# Patient Record
Sex: Male | Born: 1990 | Race: Black or African American | Hispanic: No | Marital: Single | Smoking: Current every day smoker
Health system: Southern US, Community
[De-identification: ages and names within clinical notes are randomized; demographics above are authoritative.]

---

## 2015-01-15 ENCOUNTER — Encounter (HOSPITAL_BASED_OUTPATIENT_CLINIC_OR_DEPARTMENT_OTHER): Payer: Self-pay | Admitting: *Deleted

## 2015-01-15 ENCOUNTER — Emergency Department (HOSPITAL_BASED_OUTPATIENT_CLINIC_OR_DEPARTMENT_OTHER)
Admission: EM | Admit: 2015-01-15 | Discharge: 2015-01-15 | Disposition: A | Discharge status: Home / Self Care | Payer: BLUE CROSS/BLUE SHIELD | Attending: Emergency Medicine | Admitting: Emergency Medicine

## 2015-01-15 ENCOUNTER — Emergency Department (HOSPITAL_BASED_OUTPATIENT_CLINIC_OR_DEPARTMENT_OTHER): Payer: BLUE CROSS/BLUE SHIELD

## 2015-01-15 DIAGNOSIS — W01198A Fall on same level from slipping, tripping and stumbling with subsequent striking against other object, initial encounter: Secondary | ICD-10-CM | POA: Diagnosis not present

## 2015-01-15 DIAGNOSIS — Y92310 Basketball court as the place of occurrence of the external cause: Secondary | ICD-10-CM | POA: Insufficient documentation

## 2015-01-15 DIAGNOSIS — Y9367 Activity, basketball: Secondary | ICD-10-CM | POA: Diagnosis not present

## 2015-01-15 DIAGNOSIS — M25532 Pain in left wrist: Secondary | ICD-10-CM

## 2015-01-15 DIAGNOSIS — S6992XA Unspecified injury of left wrist, hand and finger(s), initial encounter: Secondary | ICD-10-CM | POA: Insufficient documentation

## 2015-01-15 DIAGNOSIS — Y998 Other external cause status: Secondary | ICD-10-CM | POA: Diagnosis not present

## 2015-01-15 MED ORDER — IBUPROFEN 800 MG PO TABS
800.0000 mg | ORAL_TABLET | Freq: Once | ORAL | Status: AC
  Administered 2015-01-15: 800 mg via ORAL
  Filled 2015-01-15: qty 1

## 2015-01-15 MED ORDER — IBUPROFEN 600 MG PO TABS: 600.0000 mg | ORAL_TABLET | Freq: Four times a day (QID) | ORAL | Status: DC | PRN

## 2015-01-15 NOTE — ED Notes (Signed)
Patient transported to X-ray 

## 2015-01-15 NOTE — Discharge Instructions (Signed)

## 2015-01-15 NOTE — ED Notes (Signed)
Left wrist injury. He fell on his wrist while playing basketball yesterday.

## 2015-01-15 NOTE — ED Provider Notes (Signed)
CSN: [id number]     Arrival date & time 01/15/15  1613 History   First MD Initiated Contact with Patient 01/15/15 1632     Chief Complaint  Patient presents with  . Wrist Pain     (Consider location/radiation/quality/duration/timing/severity/associated sxs/prior Treatment) HPI Joseph Benjamin is a 24 y.o. male who comes in for evaluation of left wrist pain. Patient states last night he fell forward landing on his wrist while playing basketball. He reports immediate onset throbbing wrist pain that he rates as 9/10. He has not tried anything to improve his symptoms. However, he does report elevating his wrist will sometimes help, but letting it hang down worsens his symptoms. He does report intermittent tingling sensation. Denies any elbow pain. Reports decreased range of motion secondary to pain. No other aggravating or modifying factors.  History reviewed. No pertinent past medical history. History reviewed. No pertinent past surgical history. No family history on file. History  Substance Use Topics  . Smoking status: Never Smoker   . Smokeless tobacco: Not on file  . Alcohol Use: No    Review of Systems A 10 point review of systems was completed and was negative except for pertinent positives and negatives as mentioned in the history of present illness     Allergies  Review of patient's allergies indicates no known allergies.  Home Medications   Prior to Admission medications   Medication Sig Start Date End Date Taking? Authorizing Provider  ibuprofen (ADVIL,MOTRIN) 600 MG tablet Take 1 tablet (600 mg total) by mouth every 6 (six) hours as needed. 01/15/15   Joycie PeekBenjamin Meoshia Billing, PA-C   BP 122/67 mmHg  Pulse 84  Temp(Src) 98.2 F (36.8 C) (Oral)  Resp 16  Ht 5\' 7"  (1.702 m)  Wt 172 lb (78.019 kg)  BMI 26.93 kg/m2  SpO2 100% Physical Exam  Constitutional:  Awake, alert, nontoxic appearance.  HENT:  Head: Atraumatic.  Eyes: Right eye exhibits no discharge. Left eye  exhibits no discharge.  Neck: Neck supple.  Pulmonary/Chest: Effort normal. He exhibits no tenderness.  Abdominal: Soft. There is no tenderness. There is no rebound.  Musculoskeletal: He exhibits no tenderness.  No focal tenderness to left wrist. No obvious deformities or lesions noted. There is diffuse swelling around left wrist. Maintains full active range of motion with flexion, decreased range of motion with extension, secondary only to pain. Grip strength slightly decreased secondary to pain also. Distal pulses are intact with brisk cap refill.  Neurological:  Mental status and motor strength appears baseline for patient and situation. Sensation intact to light touch. No focal neurodeficits.  Skin: No rash noted.  Psychiatric: He has a normal mood and affect.  Nursing note and vitals reviewed.   ED Course  Procedures (including critical care time) Labs Review Labs Reviewed - No data to display  Imaging Review Dg Wrist Complete Left  01/15/2015   CLINICAL DATA:  Acute left wrist pain after fall playing basketball yesterday. Initial encounter.  EXAM: LEFT WRIST - COMPLETE 3+ VIEW  COMPARISON:  None.  FINDINGS: There is no evidence of fracture or dislocation. There is no evidence of arthropathy or other focal bone abnormality. Soft tissues are unremarkable.  IMPRESSION: Normal left wrist.   Electronically Signed   By: Lupita RaiderJames  Green Jr, M.D.   On: 01/15/2015 16:42     EKG Interpretation None     Meds given in ED:  Medications  ibuprofen (ADVIL,MOTRIN) tablet 800 mg (not administered)    New Prescriptions   IBUPROFEN (  ADVIL,MOTRIN) 600 MG TABLET    Take 1 tablet (600 mg total) by mouth every 6 (six) hours as needed.   Filed Vitals:   01/15/15 1617  BP: 122/67  Pulse: 84  Temp: 98.2 F (36.8 C)  TempSrc: Oral  Resp: 16  Height:  (1.702 m)  Weight: 172 lb (78.019 kg)  SpO2: 100%    MDM  Vitals stable - WNL -afebrile Pt resting comfortably in ED. PE--physical exam  as above, not concerning for any acute or emergent pathology. Imaging--plain films of left wrist are negative for any osseous abnormalities or dislocations. I personally reviewed the imaging and agree with the results as interpreted by the radiologist.  DDX--suspect wrist sprain. Placed in a wrist splint for patient comfort. Discussed ice and elevation at home. Will DC with anti-inflammatory's.  I discussed all relevant lab findings and imaging results with pt and they verbalized understanding. Discussed f/u with PCP within 48 hrs and return precautions, pt very amenable to plan.  Final diagnoses:  Wrist pain, acute, left      Joycie Peek, PA-C 01/15/15 1656  Elwin Mocha, MD 01/15/15 1901

## 2015-06-24 ENCOUNTER — Encounter (HOSPITAL_BASED_OUTPATIENT_CLINIC_OR_DEPARTMENT_OTHER): Payer: Self-pay | Admitting: Emergency Medicine

## 2015-06-24 ENCOUNTER — Emergency Department (HOSPITAL_BASED_OUTPATIENT_CLINIC_OR_DEPARTMENT_OTHER)
Admission: EM | Admit: 2015-06-24 | Discharge: 2015-06-24 | Disposition: A | Discharge status: Home / Self Care | Payer: BLUE CROSS/BLUE SHIELD | Attending: Emergency Medicine | Admitting: Emergency Medicine

## 2015-06-24 DIAGNOSIS — S3992XA Unspecified injury of lower back, initial encounter: Secondary | ICD-10-CM | POA: Diagnosis not present

## 2015-06-24 DIAGNOSIS — F17200 Nicotine dependence, unspecified, uncomplicated: Secondary | ICD-10-CM | POA: Insufficient documentation

## 2015-06-24 DIAGNOSIS — Y998 Other external cause status: Secondary | ICD-10-CM | POA: Diagnosis not present

## 2015-06-24 DIAGNOSIS — S199XXA Unspecified injury of neck, initial encounter: Secondary | ICD-10-CM | POA: Diagnosis not present

## 2015-06-24 DIAGNOSIS — Y9389 Activity, other specified: Secondary | ICD-10-CM | POA: Insufficient documentation

## 2015-06-24 DIAGNOSIS — S0990XA Unspecified injury of head, initial encounter: Secondary | ICD-10-CM | POA: Insufficient documentation

## 2015-06-24 DIAGNOSIS — Y92410 Unspecified street and highway as the place of occurrence of the external cause: Secondary | ICD-10-CM | POA: Diagnosis not present

## 2015-06-24 MED ORDER — METHOCARBAMOL 500 MG PO TABS
500.0000 mg | ORAL_TABLET | Freq: Once | ORAL | Status: AC
  Administered 2015-06-24: 500 mg via ORAL
  Filled 2015-06-24: qty 1

## 2015-06-24 MED ORDER — IBUPROFEN 800 MG PO TABS
800.0000 mg | ORAL_TABLET | Freq: Once | ORAL | Status: AC
  Administered 2015-06-24: 800 mg via ORAL
  Filled 2015-06-24: qty 1

## 2015-06-24 MED ORDER — METHOCARBAMOL 500 MG PO TABS: 500.0000 mg | ORAL_TABLET | Freq: Two times a day (BID) | ORAL | Status: DC

## 2015-06-24 MED ORDER — IBUPROFEN 800 MG PO TABS: 800.0000 mg | ORAL_TABLET | Freq: Three times a day (TID) | ORAL | Status: DC
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
## 2016-08-15 IMAGING — CR DG WRIST COMPLETE 3+V*L*
4 series · 4 of 4 positions shown · non-contrast
Comparison: None.

CLINICAL DATA: Acute left wrist pain after fall playing basketball
yesterday. Initial encounter.

EXAM:
LEFT WRIST - COMPLETE 3+ VIEW

[x wrist pa left]
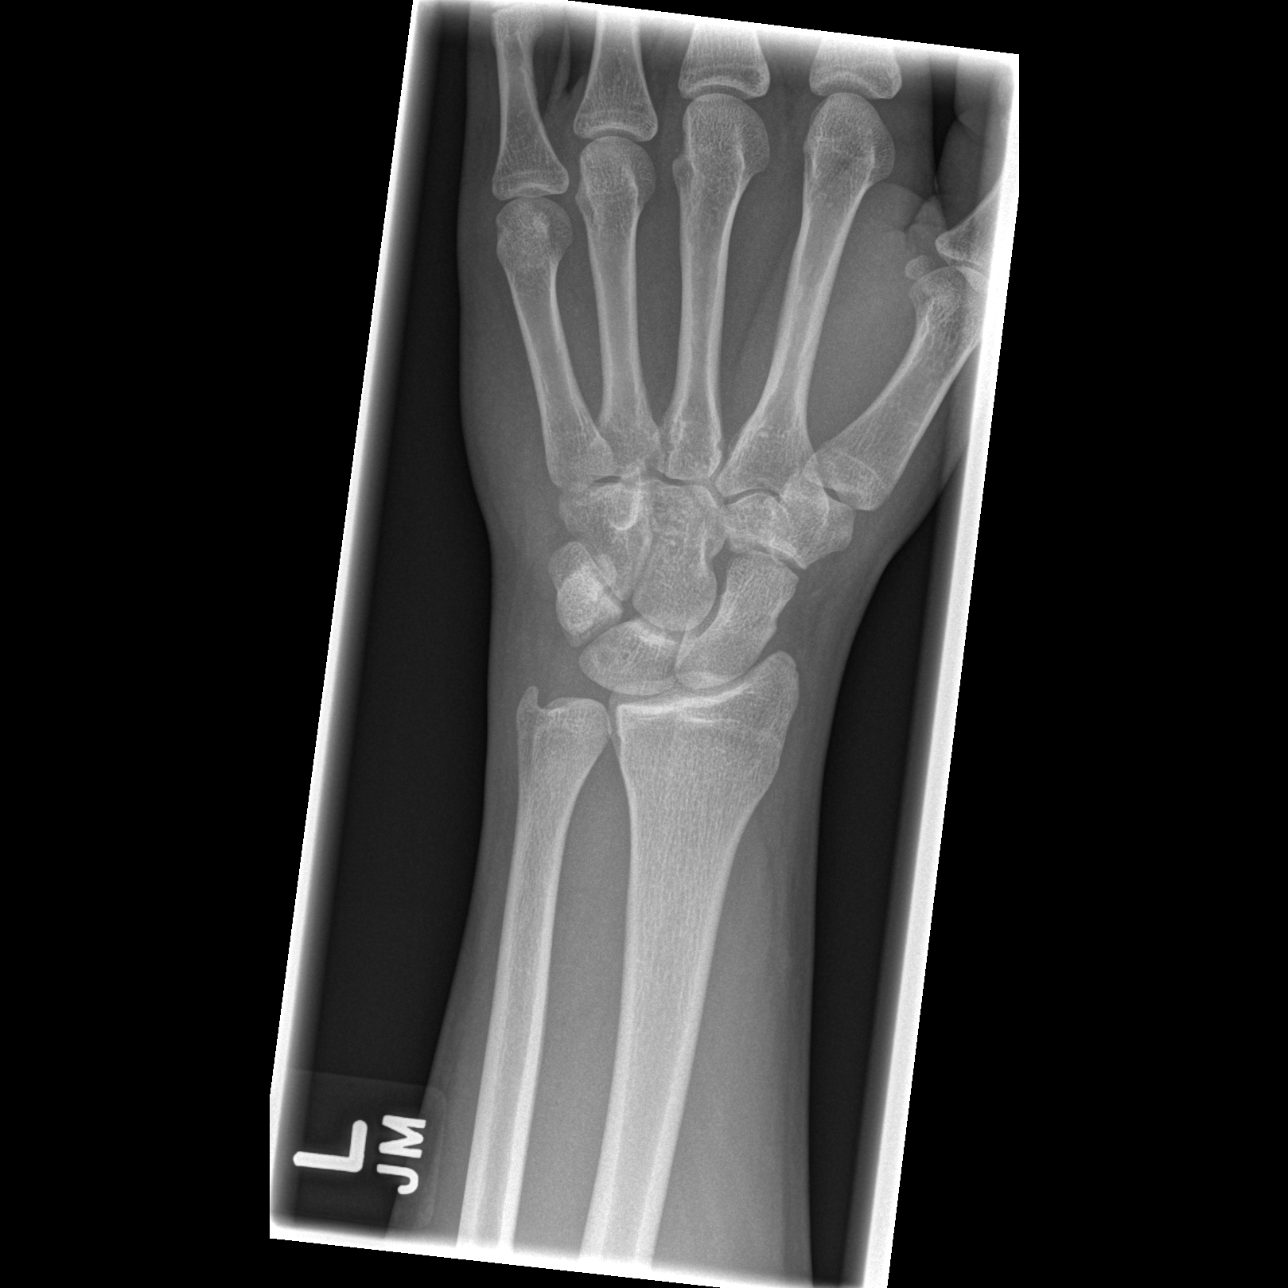

[x wrist obl left]
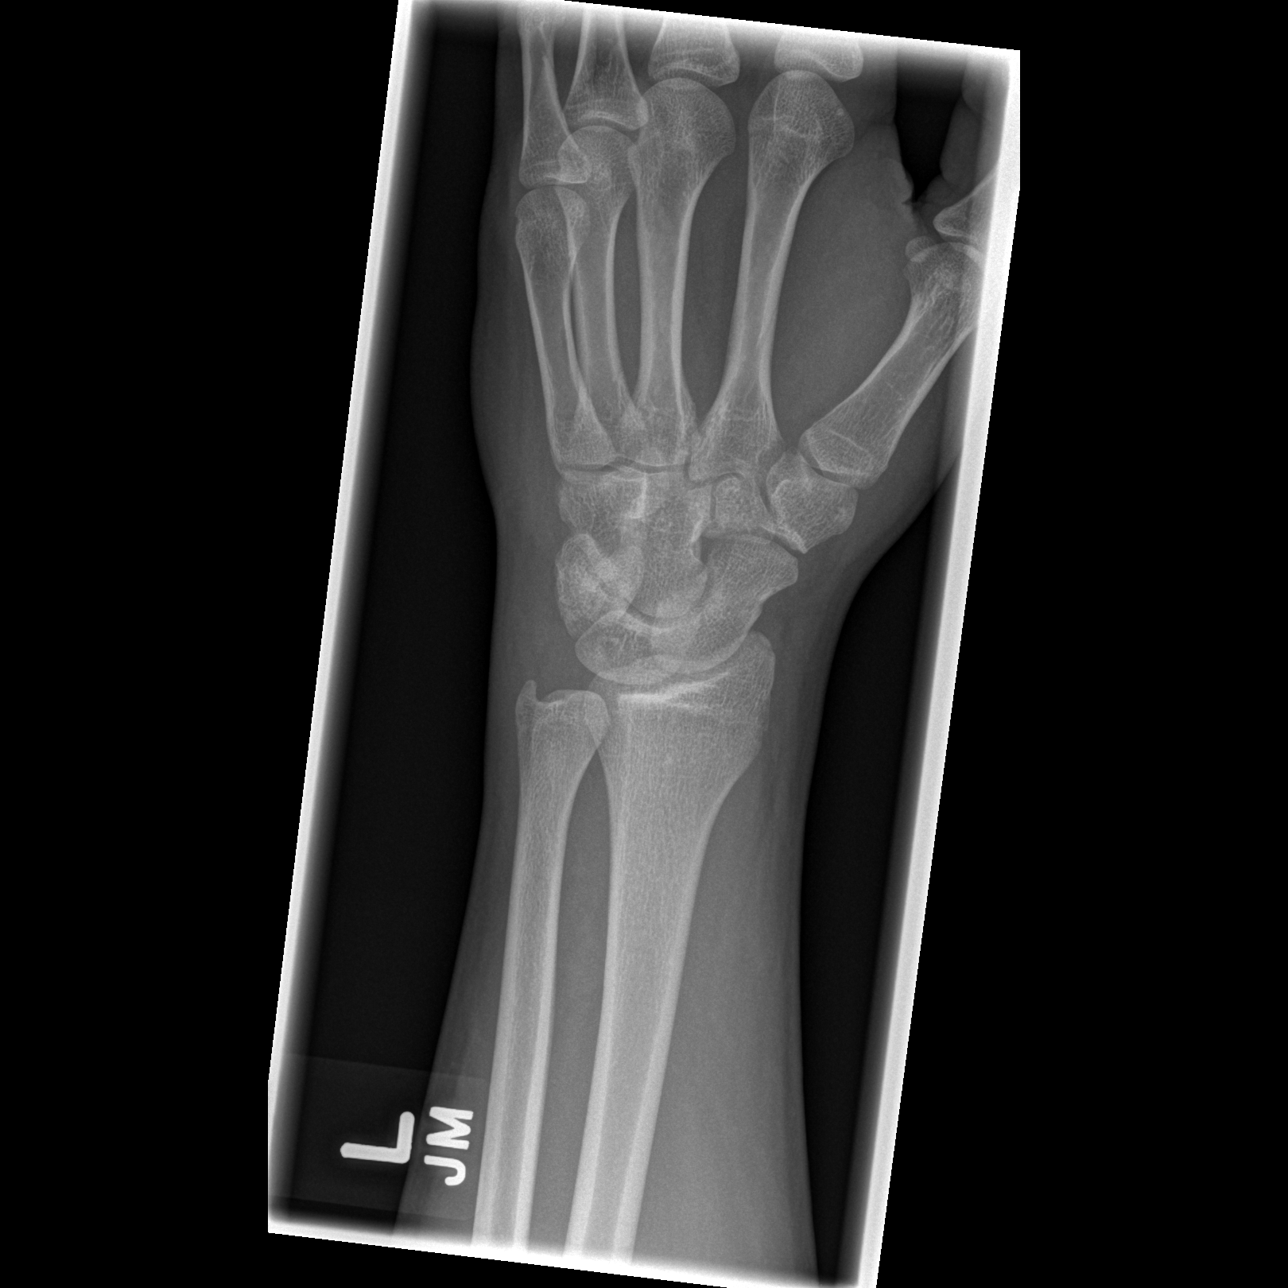

[x wrist lat left]
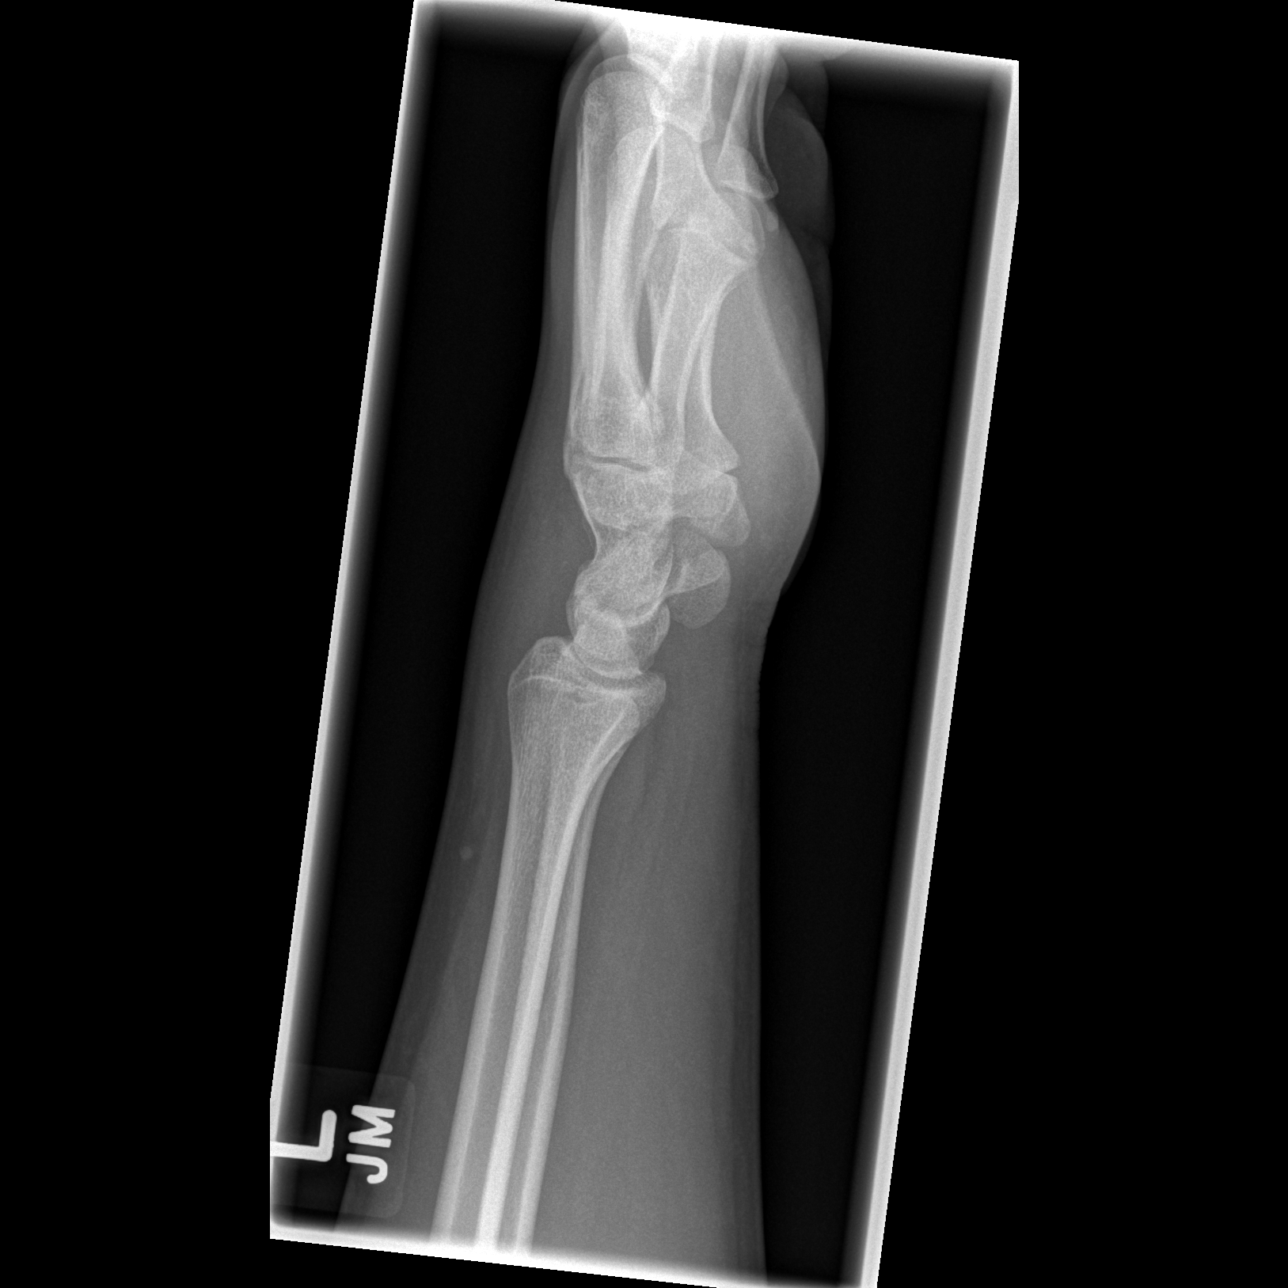

[x navicular]
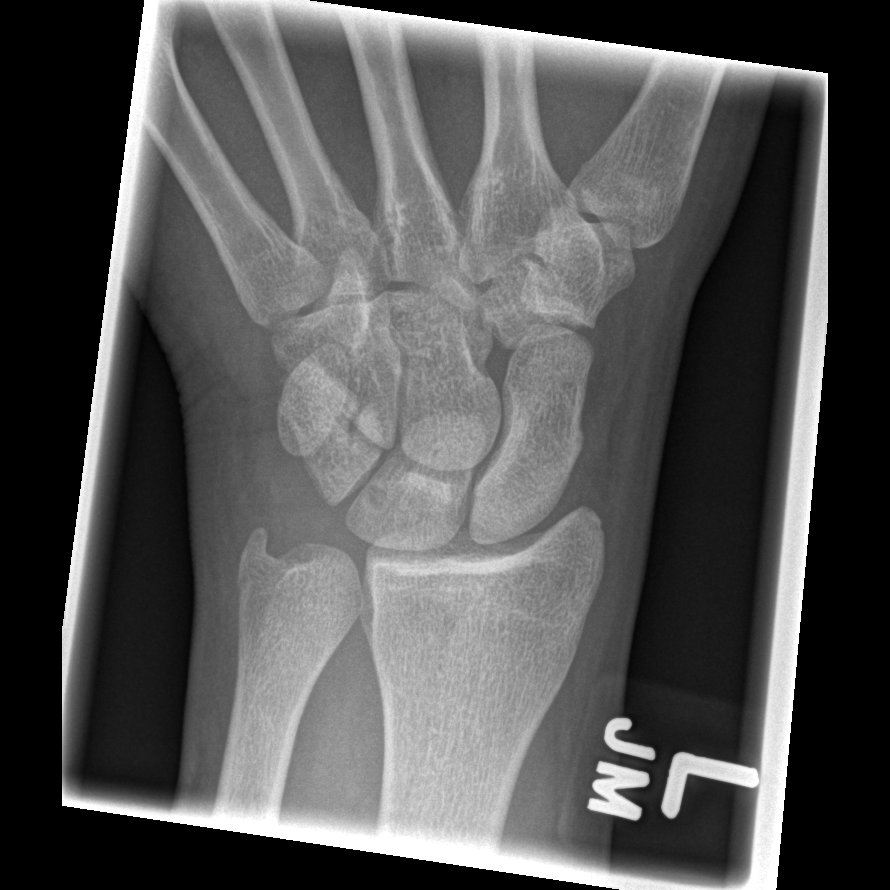

[4 of 4 positions shown; findings below may reference images not displayed]

FINDINGS: There is no evidence of fracture or dislocation. There is no
evidence of arthropathy or other focal bone abnormality. Soft
tissues are unremarkable.
IMPRESSION: Normal left wrist.

## 2019-06-08 ENCOUNTER — Other Ambulatory Visit: Payer: Self-pay

## 2019-06-08 DIAGNOSIS — Z20822 Contact with and (suspected) exposure to covid-19: Secondary | ICD-10-CM

## 2019-06-09 LAB — NOVEL CORONAVIRUS, NAA: SARS-CoV-2, NAA: NOT DETECTED

## 2019-08-30 ENCOUNTER — Ambulatory Visit (HOSPITAL_COMMUNITY)
Admission: EM | Admit: 2019-08-30 | Discharge: 2019-08-30 | Disposition: A | Discharge status: Home / Self Care | Payer: Managed Care, Other (non HMO) | Attending: Family Medicine | Admitting: Family Medicine

## 2019-08-30 ENCOUNTER — Ambulatory Visit (INDEPENDENT_AMBULATORY_CARE_PROVIDER_SITE_OTHER): Payer: Managed Care, Other (non HMO)

## 2019-08-30 ENCOUNTER — Other Ambulatory Visit: Payer: Self-pay

## 2019-08-30 ENCOUNTER — Encounter (HOSPITAL_COMMUNITY): Payer: Self-pay

## 2019-08-30 DIAGNOSIS — M545 Low back pain, unspecified: Secondary | ICD-10-CM

## 2019-08-30 DIAGNOSIS — M20012 Mallet finger of left finger(s): Secondary | ICD-10-CM | POA: Diagnosis not present

## 2019-08-30 DIAGNOSIS — M20002 Unspecified deformity of left finger(s): Secondary | ICD-10-CM

## 2019-08-30 MED ORDER — IBUPROFEN 800 MG PO TABS: 800.0000 mg | ORAL_TABLET | Freq: Three times a day (TID) | ORAL | 0 refills | Status: AC

## 2019-08-30 MED ORDER — CYCLOBENZAPRINE HCL 5 MG PO TABS: 5.0000 mg | ORAL_TABLET | Freq: Two times a day (BID) | ORAL | 0 refills | Status: AC | PRN

## 2019-08-30 NOTE — Discharge Instructions (Addendum)
Please follow up with hand for finger deformity Please wear splint 24/7 to immobilize finger Use anti-inflammatories for pain/swelling. You may take up to 800 mg Ibuprofen every 8 hours with food. You may supplement Ibuprofen with Tylenol [phone] mg every 8 hours.   You may use flexeril/cyclobenzeprine as needed to help with back pain. This is a muscle relaxer and causes sedation- please use only at bedtime or when you will be home and not have to drive/work
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
